# Patient Record
Sex: Male | Born: 2005 | Race: White | Hispanic: No | Marital: Single | Smoking: Never smoker
Health system: Southern US, Community
[De-identification: ages and names within clinical notes are randomized; demographics above are authoritative.]

## PROBLEM LIST (undated history)

## (undated) DIAGNOSIS — Z8659 Personal history of other mental and behavioral disorders: Secondary | ICD-10-CM

## (undated) DIAGNOSIS — F819 Developmental disorder of scholastic skills, unspecified: Secondary | ICD-10-CM

## (undated) DIAGNOSIS — R4689 Other symptoms and signs involving appearance and behavior: Secondary | ICD-10-CM

## (undated) DIAGNOSIS — F952 Tourette's disorder: Secondary | ICD-10-CM

## (undated) DIAGNOSIS — F959 Tic disorder, unspecified: Secondary | ICD-10-CM

## (undated) HISTORY — DX: Tourette's disorder: F95.2

## (undated) HISTORY — DX: Tic disorder, unspecified: F95.9

## (undated) HISTORY — DX: Developmental disorder of scholastic skills, unspecified: F81.9

## (undated) HISTORY — DX: Other symptoms and signs involving appearance and behavior: R46.89

## (undated) HISTORY — DX: Personal history of other mental and behavioral disorders: Z86.59

---
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
## 2024-01-22 ENCOUNTER — Ambulatory Visit (INDEPENDENT_AMBULATORY_CARE_PROVIDER_SITE_OTHER)

## 2024-01-22 DIAGNOSIS — B079 Viral wart, unspecified: Secondary | ICD-10-CM | POA: Diagnosis not present

## 2024-01-22 NOTE — Patient Instructions (Signed)
 AT HOME WART TREATMENT:    To remove your wart with a 40% salicylic acid plaster (e.g. Mediplast, Duoplant) follow these instructions:  1. Soak the area for 10-15 minutes in lukewarm water.  2. Remove the white or rough dead skin using a small piece of fine or medium grade sandpaper. Use a new piece each time. Be careful not to cause bleeding.  If it does bleed, apply pressure to the site for 15 minutes; apply Vaseline and a Band-Aid. Resume treatment to the wart the following day.  3. Once you have soaked the area and removed the overlying dead skin, then cut the piece of plaster so that it covers the wart and only a small amount of normal skin surrounding the wart.  4.Apply the sticky side of the plaster to the wart and cover with waterproof adhesive or duct tape until the patch is secured.  5.Leave the patch in place for 24 hours (or 8 hours overnight if tender) and repeat the process starting with step #1.  You may find with prolonged use that the area becomes tender.     Due to recent changes in healthcare laws, you may see results of your pathology and/or laboratory studies on MyChart before the doctors have had a chance to review them. We understand that in some cases there may be results that are confusing or concerning to you. Please understand that not all results are received at the same time and often the doctors may need to interpret multiple results in order to provide you with the best plan of care or course of treatment. Therefore, we ask that you please give us  2 business days to thoroughly review all your results before contacting the office for clarification. Should we see a critical lab result, you will be contacted sooner.   If You Need Anything After Your Visit  If you have any questions or concerns for your doctor, please call our main line at [phone] and press option 4 to reach your doctor's medical assistant. If no one answers, please leave a voicemail as directed and we will  return your call as soon as possible. Messages left after 4 pm will be answered the following business day.   You may also send us  a message via MyChart. We typically respond to MyChart messages within 1-2 business days.  For prescription refills, please ask your pharmacy to contact our office. Our fax number is [phone].  If you have an urgent issue when the clinic is closed that cannot wait until the next business day, you can page your doctor at the number below.    Please note that while we do our best to be available for urgent issues outside of office hours, we are not available 24/7.   If you have an urgent issue and are unable to reach us , you may choose to seek medical care at your doctor's office, retail clinic, urgent care center, or emergency room.  If you have a medical emergency, please immediately call 911 or go to the emergency department.  Pager Numbers  - Dr. Hester: [phone]  - Dr. Jackquline: [phone]  - Dr. Claudene: [phone]   - Dr. Raymund: [phone]  In the event of inclement weather, please call our main line at [phone] for an update on the status of any delays or closures.  Dermatology Medication Tips: Please keep the boxes that topical medications come in in order to help keep track of the instructions about where and how to use these.  Pharmacies typically print the medication instructions only on the boxes and not directly on the medication tubes.   If your medication is too expensive, please contact our office at [phone] option 4 or send us  a message through MyChart.   We are unable to tell what your co-pay for medications will be in advance as this is different depending on your insurance coverage. However, we may be able to find a substitute medication at lower cost or fill out paperwork to get insurance to cover a needed medication.   If a prior authorization is required to get your medication covered by your insurance company,  please allow us  1-2 business days to complete this process.  Drug prices often vary depending on where the prescription is filled and some pharmacies may offer cheaper prices.  The website www.goodrx.com contains coupons for medications through different pharmacies. The prices here do not account for what the cost may be with help from insurance (it may be cheaper with your insurance), but the website can give you the price if you did not use any insurance.  - You can print the associated coupon and take it with your prescription to the pharmacy.  - You may also stop by our office during regular business hours and pick up a GoodRx coupon card.  - If you need your prescription sent electronically to a different pharmacy, notify our office through [facility] or by phone at [phone] option 4.     Si Usted Necesita Algo Despus de Su Visita  Tambin puede enviarnos un mensaje a travs de Clinical cytogeneticist. Por lo general respondemos a los mensajes de MyChart en el transcurso de 1 a 2 das hbiles.  Para renovar recetas, por favor pida a su farmacia que se ponga en contacto con nuestra oficina. Randi lakes de fax es [place] [phone].  Si tiene un asunto urgente cuando la clnica est cerrada y que no puede esperar hasta el siguiente da hbil, puede llamar/localizar a su doctor(a) al nmero que aparece a continuacin.   Por favor, tenga en cuenta que aunque hacemos todo lo posible para estar disponibles para asuntos urgentes fuera del horario de [place], no estamos disponibles las 24 horas del da, los 7 [address]  [address] de la [place].   Si tiene un problema urgente y no puede comunicarse con nosotros, puede optar por buscar atencin mdica  en el consultorio de su doctor(a), en una clnica privada, en un centro de atencin urgente o en una sala de emergencias.  Si tiene Engineer, drilling, por favor llame inmediatamente al 911 o vaya a la sala de emergencias.  Nmeros de bper  - Dr. Hester:  [phone]  - Dra. Jackquline: [phone]  - Dr. Claudene: [phone]  - Dra. Kitts: [phone]  En caso de inclemencias del [place], por favor llame a nuestra lnea principal al [phone] para una actualizacin sobre el estado de cualquier retraso o cierre.  Consejos para la medicacin en dermatologa: Por favor, guarde las cajas en las que vienen los medicamentos de uso tpico para ayudarle a seguir las instrucciones sobre dnde y cmo usarlos. Las farmacias generalmente imprimen las instrucciones del medicamento slo en las cajas y no directamente en los tubos del [place].   Si su medicamento es muy caro, por favor, pngase en contacto con landry rieger llamando al [phone] y presione la opcin 4 o envenos un mensaje a travs de Clinical cytogeneticist.   No podemos decirle cul ser su copago por los medicamentos por adelantado ya que esto es  diferente dependiendo de la cobertura de su seguro. Sin embargo, es posible que podamos encontrar un medicamento sustituto a Audiological scientist un formulario para que el seguro cubra el medicamento que se considera necesario.   Si se requiere una autorizacin previa para que su compaa de seguros malta su medicamento, por favor permtanos de 1 a 2 das hbiles para completar este proceso.  Los precios de los medicamentos varan con frecuencia dependiendo del Environmental consultant de dnde se surte la receta y alguna farmacias pueden ofrecer precios ms baratos.  El sitio web www.goodrx.com tiene cupones para medicamentos de Health and safety inspector. Los precios aqu no tienen en cuenta lo que podra costar con la ayuda del seguro (puede ser ms barato con su seguro), pero el sitio web puede darle el precio si no utiliz Tourist information centre manager.  - Puede imprimir el cupn correspondiente y llevarlo con su receta a la farmacia.  - Tambin puede pasar por nuestra oficina durante el horario de atencin regular y Education officer, museum una tarjeta de cupones de GoodRx.  - Si necesita que su receta  se enve electrnicamente a una farmacia diferente, informe a nuestra oficina a travs de MyChart de [place] o por telfono llamando al [phone] y presione la opcin 4.

## 2024-01-22 NOTE — Progress Notes (Signed)
    Subjective   Richard May is a 18 y.o. male who presents for the following: Lesion(s) of concern . Patient is established patient   Today patient reports: Area of concern on right fourth digit.   Review of Systems:    No other skin or systemic complaints except as noted in HPI or Assessment and Plan.  The following portions of the chart were reviewed this encounter and updated as appropriate: medications, allergies, medical history  Relevant Medical History:  History of warts  Objective  Well appearing patient in no apparent distress; mood and affect are within normal limits. Examination was performed of the: Focused Exam of: Bilateral hands   Examination notable for: Warts: Well-demarcated verrucous, hyperkeratotic papule(s) located on the right fourth finger    Examination limited by: N/A     Assessment & Plan   Verruca vulgaris - R 4th digit vs less likely callus  - Acute, uncomplicated  - Discussed diagnosis, typical course, and treatment options for this condition - Patient opted for cryotherapy treatment at today's visit - Informed patient that multiple treatments will be necessary, study shows an average of roughly 5 treatments - Discussed usage of OTC mediplast or salicylic acid, topically, paring down, requiring several weeks to heal.  Cryotherapy Procedure Note Cryotherapy of warts was performed using liquid nitrogen after discussing the risks and benefits of the procedure including scarring, dyspigmentation, recurrence or persistence of the lesion.    Number treated: 1     Procedures, orders, diagnosis for this visit:  VIRAL WARTS, UNSPECIFIED TYPE Right Distal 4th Finger Destruction of lesion - Right Distal 4th Finger Complexity: simple   Destruction method: cryotherapy   Informed consent: discussed and consent obtained   Timeout:  patient name, date of birth, surgical site, and procedure verified Lesion destroyed using liquid nitrogen: Yes   Region  frozen until ice ball extended beyond lesion: Yes   Cryo cycles: 1 or 2. Outcome: patient tolerated procedure well with no complications   Post-procedure details: wound care instructions given   Additional details:  Prior to procedure, discussed risks of blister formation, small wound, skin dyspigmentation, or rare scar following cryotherapy. Recommend Vaseline ointment to treated areas while healing.    Viral warts, unspecified type -     Destruction of lesion    Return to clinic: Return if symptoms worsen or fail to improve.  Documentation:  I, Emerick Ege, CMA am acting as scribe for Lauraine JAYSON Kanaris, MD   I have reviewed the above documentation for accuracy and completeness, and I agree with the above.  Lauraine JAYSON Kanaris, MD
